# Patient Record
Sex: Male | Born: 1995 | Hispanic: No | Marital: Single | Smoking: Never smoker
Health system: Southern US, Community
[De-identification: ages and names within clinical notes are randomized; demographics above are authoritative.]

## PROBLEM LIST (undated history)

## (undated) DIAGNOSIS — F840 Autistic disorder: Secondary | ICD-10-CM

## (undated) DIAGNOSIS — F909 Attention-deficit hyperactivity disorder, unspecified type: Secondary | ICD-10-CM

## (undated) DIAGNOSIS — F913 Oppositional defiant disorder: Secondary | ICD-10-CM

## (undated) DIAGNOSIS — F79 Unspecified intellectual disabilities: Secondary | ICD-10-CM

## (undated) DIAGNOSIS — F849 Pervasive developmental disorder, unspecified: Secondary | ICD-10-CM

## (undated) HISTORY — PX: KNEE SURGERY: SHX244

## (undated) HISTORY — PX: FOOT SURGERY: SHX648

---
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
## 2020-12-12 ENCOUNTER — Encounter (HOSPITAL_COMMUNITY): Payer: Self-pay

## 2020-12-12 ENCOUNTER — Emergency Department (HOSPITAL_COMMUNITY)
Admission: EM | Admit: 2020-12-12 | Discharge: 2020-12-13 | Disposition: A | Discharge status: Home / Self Care | Payer: Medicare Other | Attending: Emergency Medicine | Admitting: Emergency Medicine

## 2020-12-12 DIAGNOSIS — F913 Oppositional defiant disorder: Secondary | ICD-10-CM | POA: Insufficient documentation

## 2020-12-12 DIAGNOSIS — Z20822 Contact with and (suspected) exposure to covid-19: Secondary | ICD-10-CM | POA: Diagnosis not present

## 2020-12-12 DIAGNOSIS — Z79899 Other long term (current) drug therapy: Secondary | ICD-10-CM | POA: Insufficient documentation

## 2020-12-12 DIAGNOSIS — R462 Strange and inexplicable behavior: Secondary | ICD-10-CM | POA: Diagnosis present

## 2020-12-12 DIAGNOSIS — R4689 Other symptoms and signs involving appearance and behavior: Secondary | ICD-10-CM

## 2020-12-12 DIAGNOSIS — F901 Attention-deficit hyperactivity disorder, predominantly hyperactive type: Secondary | ICD-10-CM | POA: Insufficient documentation

## 2020-12-12 DIAGNOSIS — F840 Autistic disorder: Secondary | ICD-10-CM | POA: Insufficient documentation

## 2020-12-12 LAB — CBC WITH DIFFERENTIAL/PLATELET
Abs Immature Granulocytes: 0.03 10*3/uL (ref 0.00–0.07)
Basophils Absolute: 0 10*3/uL (ref 0.0–0.1)
Basophils Relative: 0 %
Eosinophils Absolute: 0 10*3/uL (ref 0.0–0.5)
Eosinophils Relative: 0 %
HCT: 42.3 % (ref 39.0–52.0)
Hemoglobin: 14 g/dL (ref 13.0–17.0)
Immature Granulocytes: 0 %
Lymphocytes Relative: 13 %
Lymphs Abs: 1.5 10*3/uL (ref 0.7–4.0)
MCH: 30.4 pg (ref 26.0–34.0)
MCHC: 33.1 g/dL (ref 30.0–36.0)
MCV: 91.8 fL (ref 80.0–100.0)
Monocytes Absolute: 0.9 10*3/uL (ref 0.1–1.0)
Monocytes Relative: 8 %
Neutro Abs: 8.9 10*3/uL — ABNORMAL HIGH (ref 1.7–7.7)
Neutrophils Relative %: 79 %
Platelets: 350 10*3/uL (ref 150–400)
RBC: 4.61 MIL/uL (ref 4.22–5.81)
RDW: 12.2 % (ref 11.5–15.5)
WBC: 11.4 10*3/uL — ABNORMAL HIGH (ref 4.0–10.5)
nRBC: 0 % (ref 0.0–0.2)

## 2020-12-12 LAB — COMPREHENSIVE METABOLIC PANEL
ALT: 29 U/L (ref 0–44)
AST: 22 U/L (ref 15–41)
Albumin: 4.1 g/dL (ref 3.5–5.0)
Alkaline Phosphatase: 88 U/L (ref 38–126)
Anion gap: 10 (ref 5–15)
BUN: 11 mg/dL (ref 6–20)
CO2: 23 mmol/L (ref 22–32)
Calcium: 9.3 mg/dL (ref 8.9–10.3)
Chloride: 107 mmol/L (ref 98–111)
Creatinine, Ser: 1.22 mg/dL (ref 0.61–1.24)
GFR, Estimated: >60 mL/min (ref >60)
Glucose, Bld: 119 mg/dL — ABNORMAL HIGH (ref 70–99)
Potassium: 4.1 mmol/L (ref 3.5–5.1)
Sodium: 140 mmol/L (ref 135–145)
Total Bilirubin: 0.4 mg/dL (ref 0.3–1.2)
Total Protein: 7.5 g/dL (ref 6.5–8.1)

## 2020-12-12 LAB — RAPID URINE DRUG SCREEN, HOSP PERFORMED
Amphetamines: NOT DETECTED
Barbiturates: NOT DETECTED
Benzodiazepines: NOT DETECTED
Cocaine: NOT DETECTED
Opiates: NOT DETECTED
Tetrahydrocannabinol: NOT DETECTED

## 2020-12-12 LAB — ETHANOL: Alcohol, Ethyl (B): <10 mg/dL (ref <10)

## 2020-12-12 LAB — LIPASE, BLOOD: Lipase: 29 U/L (ref 11–51)

## 2020-12-12 MED ORDER — ONDANSETRON 4 MG PO TBDP
4.0000 mg | ORAL_TABLET | Freq: Once | ORAL | Status: AC
  Administered 2020-12-12: 4 mg via ORAL
  Filled 2020-12-12: qty 1

## 2020-12-12 MED ORDER — ONDANSETRON 4 MG PO TBDP: 4.0000 mg | ORAL_TABLET | Freq: Three times a day (TID) | ORAL | Status: DC | PRN
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
## 2020-12-12 NOTE — ED Notes (Signed)
Updates provided to pts mother Angelica Chessman- she states she can be reached at her cell listed as pts contact, and if call goes unanswered to please call her roommate to wake her up @ # [phone]

## 2020-12-12 NOTE — ED Notes (Signed)
Pt changed into burgandy scrubs by NT Diandra. Belongings placed in 9-12 locker. Pt environment checked for safety. Pt restless but redirectable.
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
## 2020-12-12 NOTE — ED Notes (Signed)
Pt belonging red shoes, blk sweat pant and shirt place at nurse desk

## 2020-12-12 NOTE — ED Notes (Signed)
Resting in stretcher comfortably. Easily redirectable. Awaiting TOC consult to decide pts dispo.
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
## 2020-12-12 NOTE — Discharge Instructions (Addendum)
For your behavioral health needs you are advised to follow up with an outpatient provider.  Contact them at your earliest opportunity to schedule an appointment:  [place]:       [facility]      [phone]  IN THE [place], [place]  AREA:       [facility]      [address]      [place], [place] [address]      [phone]

## 2020-12-12 NOTE — NC FL2 (Addendum)
  [place] MEDICAID FL2 LEVEL OF CARE SCREENING TOOL     IDENTIFICATION  Patient Name: Troy Keith Birthdate: 09/23/1995 Sex: male Admission Date (Current Location): 12/12/2020  [place] and [place] Number:  [place] Medicaid: [id number] Facility and Address:  [facility]  [address] [place] [address]      Provider Number: [id number]  Attending Physician Name and Address:  Default, Provider, MD  Relative Name and Phone Number:  Kathline Magic Mother   [phone]    Current Level of Care: Hospital Recommended Level of Care: Family Care Home Prior Approval Number:    Date Approved/Denied:   PASRR Number:    Discharge Plan:      Current Diagnoses: There are no problems to display for this patient.   Orientation RESPIRATION BLADDER Height & Weight     Self (orientation appropriate to developmental level)  Normal Continent Weight: (!) 317 lb (143.8 kg) Height:  6\' 1"  (185.4 cm)  BEHAVIORAL SYMPTOMS/MOOD NEUROLOGICAL BOWEL NUTRITION STATUS      Continent Diet (Regular diet.  See discharge summary)  AMBULATORY STATUS COMMUNICATION OF NEEDS Skin   Independent Verbally                         Personal Care Assistance Level of Assistance  Bathing,Feeding,Dressing Bathing Assistance: Limited assistance Feeding assistance: Independent Dressing Assistance: Limited assistance     Functional Limitations Info  Sight,Hearing,Speech Sight Info: Adequate Hearing Info: Adequate Speech Info: Adequate    SPECIAL CARE FACTORS FREQUENCY                       Contractures Contractures Info: Not present    Additional Factors Info  Code Status,Allergies Code Status Info: full Allergies Info: Aspartame And Phenylalanine, Ibuprofen           Current Medications (12/12/2020):  This is the current hospital active medication list Current Facility-Administered Medications  Medication Dose Route Frequency Provider Last Rate Last Admin   . ondansetron (ZOFRAN-ODT) disintegrating tablet 4 mg  4 mg Oral Q8H PRN 02/11/2021, MD       Current Outpatient Medications  Medication Sig Dispense Refill  . escitalopram (LEXAPRO) 10 MG tablet Take 1 tablet by mouth every morning.    . ondansetron (ZOFRAN ODT) 4 MG disintegrating tablet Take 1 tablet (4 mg total) by mouth every 8 (eight) hours as needed for nausea. (Patient not taking: No sig reported) 8 tablet 0     Discharge Medications: Please see discharge summary for a list of discharge medications.  Relevant Imaging Results:  Relevant Lab Results:   Additional Information    Milagros Loll, LCSW

## 2020-12-12 NOTE — ED Notes (Signed)
Pt asking to eat and drink. Breakfast tray at bedside.

## 2020-12-12 NOTE — BH Assessment (Signed)
12/12/2020 Per Carolyn Coleman, NP patient is psych cleared  

## 2020-12-12 NOTE — ED Notes (Signed)
As per [facility] LCSW pts is psychiatrically cleared. Awaiting disposition planning. Notified Thomas from disposition coordinating that patients mother can be reached at her # or her roommates #.

## 2020-12-12 NOTE — ED Notes (Addendum)
Received report form night RN. Pt restless in room, as per RN Kiristin pts mother will be back this AM- will wait to perform covid swab with mom in room for comfort measures. Pt provided coloring books for distraction. States "I want a toy". Notified pt that there are no ipads in department at this time.
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
## 2020-12-13 DIAGNOSIS — R462 Strange and inexplicable behavior: Secondary | ICD-10-CM | POA: Diagnosis not present

## 2020-12-13 MED ORDER — ESCITALOPRAM OXALATE 10 MG PO TABS
10.0000 mg | ORAL_TABLET | Freq: Every morning | ORAL | Status: DC
  Administered 2020-12-13: 10 mg via ORAL
  Filled 2020-12-13: qty 1

## 2020-12-13 NOTE — Progress Notes (Signed)
TOC CSW reached out to [name] roommate [phone] in regards to unanswered call to [facility].  [name] stated to CSW that Troy Keith was not currently in her home and would have her give hospital a call back when she returns.  Courteney Alderete Tarpley-Carter, MSW, LCSW-A Pronouns:  She, Her, Hers                  [facility] Transitions of CareClinical Social Worker [email or web address] [phone]

## 2020-12-13 NOTE — Progress Notes (Signed)
TOC CSW missed a call from [facility] DSS APS.  CSW returned call to [facility] DSS APS.  CSW left HIPPA compliant message with my contact information.  Ellysa Parrack Tarpley-Carter, MSW, LCSW-A Pronouns:  She, Her, Hers                  [facility] Transitions of CareClinical Social Worker [email or web address] [phone]
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
## 2020-12-13 NOTE — ED Provider Notes (Signed)
12:58 PM-informed by nursing the patient's mother is here to pick him up and take him home.  Social work is involved with case and asked the mother to come and pick him up.  He has been evaluated by TTS and cleared from a psychiatric perspective.  Vital signs are stable.   Mancel Bale, MD 12/13/20 1302

## 2020-12-13 NOTE — Progress Notes (Signed)
TOC CSW attempted to contact pts mom/Amanda Estes [phone].  CSW left HIPPA compliant message with my contact information.  Leib Elahi Tarpley-Carter, MSW, LCSW-A Pronouns:  She, Her, Hers                  [facility] Transitions of CareClinical Social Worker [email or web address] [phone]

## 2020-12-13 NOTE — Progress Notes (Signed)
CSW received call from [place] with [facility] [phone], she was following up from a phone call left by [employer] requesting a copy of pt's psychologial assessment.  She reports that a consent will be needed.  CSW explained Elijah Birk is out of the office and CSW could offer the support of having the form faxed to CSW location, who could then fax to pt's location.  CSW was informed that mother's signature is needed as well.  CSW provided fax number and was informed that fax would be coming shortly.  Penni Homans, MSW, LCSW 12/13/2020 12:01 PM

## 2020-12-13 NOTE — ED Notes (Signed)
Pt DC off unit to home per provider. Pt alert, calm, cooperative, no s/s of distress. Belongings given to pt. Pt ambulatory off unit to, escorted off unit. Pt transported off unit by family.
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
